# Patient Record
Sex: Female | Born: 1990 | Race: Black or African American | Hispanic: No | Marital: Single | Smoking: Never smoker
Health system: Southern US, Community
[De-identification: ages and names within clinical notes are randomized; demographics above are authoritative.]

## PROBLEM LIST (undated history)

## (undated) DIAGNOSIS — L600 Ingrowing nail: Secondary | ICD-10-CM

## (undated) DIAGNOSIS — R51 Headache: Secondary | ICD-10-CM

## (undated) DIAGNOSIS — R519 Headache, unspecified: Secondary | ICD-10-CM

## (undated) HISTORY — DX: Headache, unspecified: R51.9

## (undated) HISTORY — DX: Ingrowing nail: L60.0

## (undated) HISTORY — DX: Headache: R51

## (undated) HISTORY — PX: OTHER SURGICAL HISTORY: SHX169

---
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
## 2010-03-29 ENCOUNTER — Encounter: Payer: Self-pay | Admitting: Family Medicine

## 2010-03-29 ENCOUNTER — Ambulatory Visit
Admission: RE | Admit: 2010-03-29 | Discharge: 2010-03-29 | Payer: Self-pay | Source: Home / Self Care | Attending: Family Medicine | Admitting: Family Medicine

## 2010-04-24 NOTE — Letter (Signed)
Summary: Out of School  [facility] [place]  [address] [place], [place] [address]   Phone: [phone]  Fax: [phone]    Jul 27, 2009   Student:  Chelsea Fletcher    To Whom It May Concern:   For Medical reasons, please excuse the above named student from school for the following dates:  Start:   Jul 27, 2009  End:    Jul 27, 2009   If you need additional information, please feel free to contact our office.   Sincerely,    Doristine Devoid    ****This is a legal document and cannot be tampered with.  Schools are authorized to verify all information and to do so accordingly.

## 2010-04-24 NOTE — Letter (Signed)
Summary: Work Dietitian at [employer]  [address] [place], [place] [address]   Phone: [phone]  Fax: [phone]    Today's Date: July 17, 2009  Name of Patient: Chelsea Fletcher  The above named patient had a medical visit today at:  10:30am .  Please take this into consideration when reviewing the time away from school.    Special Instructions:  [ X ] None  [  ] To be off the remainder of today, returning to the normal work / school schedule tomorrow.  [  ] To be off until the next scheduled appointment on ______________________.  [  ] Other ________________________________________________________________ ________________________________________________________________________   Sincerely yours,   Neena Rhymes, MD  Appended Document: Work Excuse emailed to patient mother

## 2010-04-24 NOTE — Assessment & Plan Note (Signed)
Summary: DEPO SHOT//KN  Nurse Visit   Allergies: No Known Drug Allergies  Medication Administration  Injection # 1:    Medication: Depo-Provera 150mg     Diagnosis: CONTRACEPTIVE MANAGEMENT (ICD-V25.09)    Route: IM    Site: L deltoid    Exp Date: 04/25/2012    Lot #: [id number]    Mfr: Greenstone    Given by: Doristine Devoid CMA (October 18, 2009 4:47 PM)  Orders Added: 1)  Depo-Provera 150mg  [J1055] 2)  Admin of Therapeutic Inj  intramuscular or subcutaneous [96372]   Medication Administration  Injection # 1:    Medication: Depo-Provera 150mg     Diagnosis: CONTRACEPTIVE MANAGEMENT (ICD-V25.09)    Route: IM    Site: L deltoid    Exp Date: 04/25/2012    Lot #: [id number]    Mfr: Greenstone    Given by: Doristine Devoid CMA (October 18, 2009 4:47 PM)  Orders Added: 1)  Depo-Provera 150mg  [J1055] 2)  Admin of Therapeutic Inj  intramuscular or subcutaneous [09811]

## 2010-04-24 NOTE — Assessment & Plan Note (Signed)
Summary: first depo injection//lch  Nurse Visit   Allergies: No Known Drug Allergies  Medication Administration  Injection # 1:    Medication: Depo-Provera 150mg     Diagnosis: CONTRACEPTIVE MANAGEMENT (ICD-V25.09)    Route: IM    Site: R deltoid    Exp Date: 07/24/2011    Lot #: [id number]    Mfr: Francisca December    Given by: Doristine Devoid (Jul 27, 2009 4:40 PM)  Orders Added: 1)  Depo-Provera 150mg  [J1055] 2)  Admin of Therapeutic Inj  intramuscular or subcutaneous [96372]   Medication Administration  Injection # 1:    Medication: Depo-Provera 150mg     Diagnosis: CONTRACEPTIVE MANAGEMENT (ICD-V25.09)    Route: IM    Site: R deltoid    Exp Date: 07/24/2011    Lot #: [id number]    Mfr: Francisca December    Given by: Doristine Devoid (Jul 27, 2009 4:40 PM)  Orders Added: 1)  Depo-Provera 150mg  [J1055] 2)  Admin of Therapeutic Inj  intramuscular or subcutaneous [95621]

## 2010-04-24 NOTE — Letter (Signed)
Summary: Immunization/Shot Record  [facility]  [address] [place], [place] [address]   Phone: [phone]  Fax: [phone]     Immunization Record for: [facility]  Vaccine 1 2 3 4 5 6  HepB Hepatitis B 01/03/2003      02/07/2003      07/04/2003          DTP Diphtheria, Tetanus, Pertussis 09/18/1990      11/17/1990      03/22/1991      10/19/1991    07/25/1994       HIB Haemophilus influenzae Type b 09/18/1990     11/17/1990     03/22/1991     10/19/1991     XBMWUXLKGM IPV Inactivated Poliovirus 09/18/1990    11/17/1990    10/19/1991    07/25/1994    MMR Measles, Mumps, Rubella 10/19/1991 07/25/1994  Marguerite Olea WNUUVOZDGU YQIHKVQQVZ Varicella Varivax    DGLOVFIEPP IRJJOACZYS AYTKZSWFUX Pneumococcal           Hep A Hepatitis A 10/08/2006  04/10/2007  Marguerite Olea NATFTDDUKG URKYHCWCBJ SEGBTDVVOH        Tetanus Booster Date of Last: Historical 09/24/2004  Flu Shot Date of Last:  Pneumovax Date of Last:  Meningococcal Vaccine Given: Historical 07/28/2006     Other Vaccines HPV Vaccine/ Date of Last: Gardasil 07/28/2006  Vaccine/ Date of Last: Gardasil 10/08/2006  Vaccine/ Date of Last: Gardasil 03/04/2007   Marguerite Olea  YWVPXTGGYI  RSWNIOEVOJ Rotavirus Vaccine/ Date of Last:    Vaccine/ Date of Last:    Vaccine/ Date of Last:     JKKXFGHWEX  [facility]  HBZJIRCVEL Zostavax Vaccine/ Date of Last:     Marguerite Olea  FYBOFBPZWC  Marguerite Olea  HENIDPOEUM  PNTIRWERXV  Recommended Childhood and Adolescent Immunization Schedule [place]  2006 Vaccine Age Birth 1 mos 2 mos 4 mos 6 mos 12 mos 15 mos 18 mos 24 mos 4-6 yrs 11-12 yrs 13-14 yrs 15 yrs 16-18 yrs Hepatitis B1 HepB HepB HepB1  HepB  HepB Series Catch-Up Diphtheria, Tetanus, Pertussis2   DTaP DTaP DTaP   DTaP  DTaP Tdap  Tdap  Catch-Up Haemophilus influenzae type b3   Hib Hib Hib3 Hib        Inactivated Poliovirus   IPV IPV  IPV   IPV     Measles, Mumps, Rubella4      MMR   MMR M MR MMR Catch-Up Varicella5       Varicella  Varicella Catch-Up Meningo-coccal6           MCV4  MCV4 CatchUpV4           MPSV4 for High Risk Groups  C MCV4 for High Risk Groups Pneumo-coccal7   PCV PCV PCV PCV   PCV  Catch-Up PPV for High Risk Groups         PPV for High Risk Groups  Influenza8      Influenza (Yearly)  Influenza (Yearly) for High Risk Groups Hepatitis A9       HepA Series  This schedule indicates the recommended ages for routine administration of currently licensed childhood vaccines, as of February 23, 2004, for children through age 18 years. Any dose not administered at the recommended age should be administered at any subsequent visit when indicated and feasible. Indicates age groups that warrant special effort to administer those vaccines not previously administered. Additional vaccines may be licensed and recommended during the year. Licensed combination vaccines may be used whenever any components of the combination are indicated  and other components of the vaccine are not contraindicated and if approved by the Food and Drug Administration for that dose of the series. Providers should consult the respective ACIP statement for detailed recommendations. Clinically significant adverse events that follow immunization should be reported to the Vaccine Adverse Event Reporting System (VAERS). Guidance about how to obtain and complete a VAERS form is available at www.vaers.LAgents.no or by telephone, (516)[phone].  The Childhood and Adolescent Immunization Schedule is approved by: Advisory Committee on Administrator http://www.wade.com/   American Academy of Pediatrics BridgeDigest.com.cy   American Academy of [employer].aafp.org

## 2010-04-24 NOTE — Assessment & Plan Note (Signed)
Summary: DISCUSS BIRTHCONTROL/KDC   Vital Signs:  Patient profile:   20 year old female Weight:      166 pounds Pulse rate:   86 / minute BP sitting:   114 / 76  (left arm)  Vitals Entered By: Doristine Devoid (July 17, 2009 10:44 AM) CC: discuss birth control and L eye redness and itching    History of Present Illness: 20 yo girl here today for   1) birth control- forgets to take pills regularly.  last week was in middle of pack and started bleeding.  pt has 1 week left of pills.  wants to switch methods b/c she isn't taking her meds properly.  2) L eye pain- started last night, red, watery.  became irritated after washing her face.  wears contacts.  last changed 1 week ago, supposed to change monthly.  has glasses she can wear.  Allergies (verified): No Known Drug Allergies  Review of Systems      See HPI  Physical Exam  General:  Well-developed,well-nourished,in no acute distress; alert,appropriate and cooperative throughout examination Head:  NCAT Eyes:  L eye injected, excessive tearing.  no pain w/ pressure on the globe  R eye minimal injxn Ears:  TMs WNL Nose:  + congestion Mouth:  +PND   Impression & Recommendations:  Problem # 1:  CONTRACEPTIVE MANAGEMENT (ICD-V25.09) Assessment Unchanged pt not taking OCPs daily as directed.  having breakthrough bleeding and more importantly is not protected from pregnancy.  pt has 1 week of pills remaining, will return next week for Depo injxn.  reviewed dosing schedule and side effects.  pt aware.  Problem # 2:  CONJUNCTIVITIS (ICD-372.30) Assessment: New pt's eye sxs and appearance most consistent w/ conjunctivitis.  start abx drops.  reviewed importance of removing contacts, encouraged pt to switch pairs but if not possible should clean thoroughly. Her updated medication list for this problem includes:    Ciprofloxacin Hcl 0.3 % Soln (Ciprofloxacin hcl) .Marland Kitchen... 2 drops every 2 hours for 2 days and then 2 drops every 4 hrs  for 5 days.  Complete Medication List: 1)  Sprintec 28 0.25-35 Mg-mcg Tabs (Norgestimate-eth estradiol) .... Take 1 pill daily as directed.  disp 1 pack 2)  Ciprofloxacin Hcl 0.3 % Soln (Ciprofloxacin hcl) .... 2 drops every 2 hours for 2 days and then 2 drops every 4 hrs for 5 days.  Patient Instructions: 1)  Please schedule a nurse visit for middle of next week to start your depo 2)  Use the eye drops as directed 3)  DO NOT wear your contacts until you finish your eye drops 4)  Wash your contacts well to avoid re-infecting your eye 5)  Don't be surprised if it spreads to the other eye 6)  Hang in there! Prescriptions: CIPROFLOXACIN HCL 0.3 % SOLN (CIPROFLOXACIN HCL) 2 drops every 2 hours for 2 days and then 2 drops every 4 hrs for 5 days.  #10 ml x 0   Entered and Authorized by:   Neena Rhymes MD   Signed by:   Neena Rhymes MD on 07/17/2009   Method used:   Print then Give to Patient   RxID:   [id number]

## 2010-04-26 NOTE — Miscellaneous (Signed)
Summary: Depo Injection/ECU  Depo Injection/ECU   Imported By: Lanelle Bal 04/06/2010 08:29:41  _____________________________________________________________________  External Attachment:    Type:   Image     Comment:   External Document

## 2010-04-26 NOTE — Assessment & Plan Note (Signed)
Summary: DEPO SHOT/KN  Nurse Visit   Allergies: No Known Drug Allergies  Medication Administration  Injection # 1:    Medication: Depo-Provera 150mg     Diagnosis: CONTRACEPTIVE MANAGEMENT (ICD-V25.09)    Route: IM    Site: L deltoid    Exp Date: 05/23/2012    Lot #: [id number]    Mfr: [email or web address]    Comments: Last injection given on 01-18-10    Patient tolerated injection without complications    Given by: Jeremy Johann CMA (March 29, 2010 2:36 PM)  Orders Added: 1)  Admin of Therapeutic Inj  intramuscular or subcutaneous [96372] 2)  Depo-Provera 150mg  [J1055]

## 2010-05-28 ENCOUNTER — Encounter: Payer: Self-pay | Admitting: Family Medicine

## 2010-05-28 ENCOUNTER — Other Ambulatory Visit: Payer: BC Managed Care – PPO

## 2010-06-15 ENCOUNTER — Ambulatory Visit (INDEPENDENT_AMBULATORY_CARE_PROVIDER_SITE_OTHER): Payer: BC Managed Care – PPO | Admitting: *Deleted

## 2010-06-15 DIAGNOSIS — Z3009 Encounter for other general counseling and advice on contraception: Secondary | ICD-10-CM

## 2010-06-15 MED ORDER — MEDROXYPROGESTERONE ACETATE 150 MG/ML IM SUSP
150.0000 mg | Freq: Once | INTRAMUSCULAR | Status: AC
  Administered 2010-06-15: 150 mg via INTRAMUSCULAR
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
## 2010-08-10 ENCOUNTER — Encounter: Payer: Self-pay | Admitting: Family Medicine

## 2010-08-28 ENCOUNTER — Ambulatory Visit (INDEPENDENT_AMBULATORY_CARE_PROVIDER_SITE_OTHER): Payer: BC Managed Care – PPO | Admitting: *Deleted

## 2010-08-28 DIAGNOSIS — Z3009 Encounter for other general counseling and advice on contraception: Secondary | ICD-10-CM

## 2010-08-28 MED ORDER — MEDROXYPROGESTERONE ACETATE 150 MG/ML IM SUSP
150.0000 mg | Freq: Once | INTRAMUSCULAR | Status: AC
  Administered 2010-08-28: 150 mg via INTRAMUSCULAR

## 2010-11-09 ENCOUNTER — Ambulatory Visit (INDEPENDENT_AMBULATORY_CARE_PROVIDER_SITE_OTHER): Payer: BC Managed Care – PPO | Admitting: *Deleted

## 2010-11-09 ENCOUNTER — Telehealth: Payer: Self-pay | Admitting: Family Medicine

## 2010-11-09 DIAGNOSIS — Z309 Encounter for contraceptive management, unspecified: Secondary | ICD-10-CM

## 2010-11-09 NOTE — Telephone Encounter (Signed)
Can get shot early- she just can't get it late.  Ok w/ her having it today

## 2010-11-09 NOTE — Telephone Encounter (Signed)
Pt is coming in today. I misunderstood what Chelsea Fletcher said.

## 2010-11-12 DIAGNOSIS — Z309 Encounter for contraceptive management, unspecified: Secondary | ICD-10-CM

## 2010-11-12 MED ORDER — MEDROXYPROGESTERONE ACETATE 150 MG/ML IM SUSP: 150.0000 mg | INTRAMUSCULAR | Status: DC

## 2010-11-12 MED ORDER — MEDROXYPROGESTERONE ACETATE 150 MG/ML IM SUSP
150.0000 mg | Freq: Once | INTRAMUSCULAR | Status: AC
  Administered 2010-11-12: 150 mg via INTRAMUSCULAR

## 2010-12-24 LAB — URINALYSIS, ROUTINE W REFLEX MICROSCOPIC
Bilirubin Urine: NEGATIVE
Ketones, ur: 15 — AB
Leukocytes, UA: NEGATIVE
Specific Gravity, Urine: 1.037 — ABNORMAL HIGH
Urobilinogen, UA: 1
pH: 6

## 2010-12-24 LAB — URINE MICROSCOPIC-ADD ON

## 2010-12-24 LAB — DIFFERENTIAL
Basophils Absolute: 0
Eosinophils Absolute: 0.1
Eosinophils Relative: 1
Monocytes Absolute: 0.5
Neutrophils Relative %: 74 — ABNORMAL HIGH

## 2010-12-24 LAB — BASIC METABOLIC PANEL
BUN: 3 — ABNORMAL LOW
BUN: 9
CO2: 24
Chloride: 106
Creatinine, Ser: 0.71
Glucose, Bld: 101 — ABNORMAL HIGH
Potassium: 3.5
Potassium: 3.6

## 2010-12-24 LAB — CBC
HCT: 31 — ABNORMAL LOW
HCT: 32 — ABNORMAL LOW
HCT: 32 — ABNORMAL LOW
HCT: 32.9 — ABNORMAL LOW
HCT: 32.9 — ABNORMAL LOW
HCT: 33.9 — ABNORMAL LOW
HCT: 34.7 — ABNORMAL LOW
Hemoglobin: 10.5 — ABNORMAL LOW
Hemoglobin: 10.6 — ABNORMAL LOW
Hemoglobin: 11.1 — ABNORMAL LOW
MCHC: 32.8
MCHC: 33
MCHC: 33
MCHC: 33
MCHC: 33
MCV: 86.7
MCV: 88.4
MCV: 89.3
MCV: 89.7
Platelets: 199
Platelets: 207
Platelets: 210
RBC: 3.61 — ABNORMAL LOW
RBC: 3.97
RBC: 4
RBC: 4.9
RDW: 12.9
RDW: 13.1
RDW: 13.2
WBC: 5
WBC: 5.5
WBC: 5.9
WBC: 6.2
WBC: 7.7

## 2010-12-24 LAB — COMPREHENSIVE METABOLIC PANEL
Albumin: 3.7
Alkaline Phosphatase: 57
CO2: 21
Chloride: 109
Glucose, Bld: 123 — ABNORMAL HIGH
Potassium: 3.1 — ABNORMAL LOW
Sodium: 138

## 2010-12-24 LAB — HEMOGLOBIN AND HEMATOCRIT, BLOOD
HCT: 31.4 — ABNORMAL LOW
Hemoglobin: 10.4 — ABNORMAL LOW

## 2010-12-24 LAB — TYPE AND SCREEN
ABO/RH(D): A POS
Antibody Screen: NEGATIVE

## 2010-12-24 LAB — GLUCOSE, CAPILLARY

## 2011-01-01 LAB — URINE MICROSCOPIC-ADD ON

## 2011-01-01 LAB — URINALYSIS, ROUTINE W REFLEX MICROSCOPIC
Bilirubin Urine: NEGATIVE
Nitrite: NEGATIVE
Specific Gravity, Urine: 1.017
Urobilinogen, UA: 0.2

## 2011-01-01 LAB — POCT PREGNANCY, URINE: Preg Test, Ur: NEGATIVE

## 2011-01-18 ENCOUNTER — Ambulatory Visit (INDEPENDENT_AMBULATORY_CARE_PROVIDER_SITE_OTHER): Payer: BC Managed Care – PPO | Admitting: *Deleted

## 2011-01-18 DIAGNOSIS — Z3009 Encounter for other general counseling and advice on contraception: Secondary | ICD-10-CM

## 2011-01-18 MED ORDER — MEDROXYPROGESTERONE ACETATE 150 MG/ML IM SUSP
150.0000 mg | Freq: Once | INTRAMUSCULAR | Status: AC
  Administered 2011-01-18: 150 mg via INTRAMUSCULAR

## 2011-03-27 ENCOUNTER — Telehealth: Payer: Self-pay | Admitting: *Deleted

## 2011-03-27 NOTE — Telephone Encounter (Signed)
i have never heard anything about periods in relation to depo injxns.  As long as she is on schedule w/ her depo and in her window, there is no reason not to have the shot.

## 2011-03-27 NOTE — Telephone Encounter (Signed)
Pt left vm stating that she is scheduled for a depo-provera shot on 03-29-11 however she noted her period came on the 03-05-11 and was told before that the earliest she can receive the shot is 7 days prior to period can she still have the shot on the 4th? Noted pt number in chart are all incorrect and pt left a number on message [phone] left vm to verfiy further information

## 2011-03-29 ENCOUNTER — Ambulatory Visit (INDEPENDENT_AMBULATORY_CARE_PROVIDER_SITE_OTHER): Payer: BC Managed Care – PPO | Admitting: *Deleted

## 2011-03-29 DIAGNOSIS — Z309 Encounter for contraceptive management, unspecified: Secondary | ICD-10-CM

## 2011-03-29 MED ORDER — MEDROXYPROGESTERONE ACETATE 150 MG/ML IM SUSP
150.0000 mg | Freq: Once | INTRAMUSCULAR | Status: AC
  Administered 2011-03-29: 150 mg via INTRAMUSCULAR

## 2011-03-29 NOTE — Telephone Encounter (Signed)
Pt came in on 03-29-11 and received depo shot with no concerns expressed to other CMA

## 2011-06-07 ENCOUNTER — Ambulatory Visit: Payer: BC Managed Care – PPO

## 2011-10-30 ENCOUNTER — Encounter: Payer: Self-pay | Admitting: Women's Health

## 2011-10-30 ENCOUNTER — Other Ambulatory Visit (HOSPITAL_COMMUNITY)
Admission: RE | Admit: 2011-10-30 | Discharge: 2011-10-30 | Disposition: A | Discharge status: Home / Self Care | Payer: BC Managed Care – PPO | Source: Ambulatory Visit | Attending: Obstetrics and Gynecology | Admitting: Obstetrics and Gynecology

## 2011-10-30 ENCOUNTER — Ambulatory Visit (INDEPENDENT_AMBULATORY_CARE_PROVIDER_SITE_OTHER): Payer: BC Managed Care – PPO | Admitting: Women's Health

## 2011-10-30 VITALS — Ht 69.25 in | Wt 177.0 lb

## 2011-10-30 DIAGNOSIS — Z01419 Encounter for gynecological examination (general) (routine) without abnormal findings: Secondary | ICD-10-CM | POA: Insufficient documentation

## 2011-10-30 DIAGNOSIS — Z113 Encounter for screening for infections with a predominantly sexual mode of transmission: Secondary | ICD-10-CM

## 2011-10-30 DIAGNOSIS — IMO0001 Reserved for inherently not codable concepts without codable children: Secondary | ICD-10-CM

## 2011-10-30 DIAGNOSIS — Z309 Encounter for contraceptive management, unspecified: Secondary | ICD-10-CM

## 2011-10-30 DIAGNOSIS — Z23 Encounter for immunization: Secondary | ICD-10-CM

## 2011-10-30 MED ORDER — NORGESTIMATE-ETH ESTRADIOL 0.25-35 MG-MCG PO TABS: 1.0000 | ORAL_TABLET | Freq: Every day | ORAL | Status: DC

## 2011-10-30 NOTE — Patient Instructions (Addendum)
Health Maintenance, 18- to 21-Year-Old SCHOOL PERFORMANCE After high school completion, the Johnmatthew Solorio adult may be attending college, technical or vocational school, or entering the military or the work force. SOCIAL AND EMOTIONAL DEVELOPMENT The Aquilla Shambley adult establishes adult relationships and explores sexual identity. Derryck Shahan adults may be living at home or in a college dorm or apartment. Increasing independence is important with Marielouise Amey adults. Throughout adolescence, teens should assume responsibility of their own health care. IMMUNIZATIONS Most Lesieli Bresee adults should be fully vaccinated. A booster dose of Tdap (tetanus, diphtheria, and pertussis, or "whooping cough"), a dose of meningococcal vaccine to protect against a certain type of bacterial meningitis, hepatitis A, human papillomarvirus (HPV), chickenpox, or measles vaccines may be indicated, if not given at an earlier age. Annual influenza or "flu" vaccination should be considered during flu season.  TESTING Annual screening for vision and hearing problems is recommended. Vision should be screened objectively at least once between 18 and 21 years of age. The Anel Creighton adult may be screened for anemia or tuberculosis. Makeda Peeks adults should have a blood test to check for high cholesterol during this time period. River Mckercher adults should be screened for use of alcohol and drugs. If the Sayvion Vigen adult is sexually active, screening for sexually transmitted infections, pregnancy, or HIV may be performed. Screening for cervical cancer should be performed within 3 years of beginning sexual activity. NUTRITION AND ORAL HEALTH  Adequate calcium intake is important. Consume 3 servings of low-fat milk and dairy products daily. For those who do not drink milk or consume dairy products, calcium enriched foods, such as juice, bread, or cereal, dark, leafy greens, or canned fish are alternate sources of calcium.   Drink plenty of water. Limit fruit juice to 8 to 12 ounces per day.  Avoid sugary beverages or sodas.   Discourage skipping meals, especially breakfast. Teens should eat a good variety of vegetables and fruits, as well as lean meats.   Avoid high fat, high salt, and high sugar foods, such as candy, chips, and cookies.   Encourage Grant Henkes adults to participate in meal planning and preparation.   Eat meals together as a family whenever possible. Encourage conversation at mealtime.   Limit fast food choices and eating out at restaurants.   Brush teeth twice a day and floss.   Schedule dental exams twice a year.  SLEEP Regular sleep habits are important. PHYSICAL, SOCIAL, AND EMOTIONAL DEVELOPMENT  One hour of regular physical activity daily is recommended. Continue to participate in sports.   Encourage Mel Langan adults to develop their own interests and consider community service or volunteerism.   Provide guidance to the Deforest Maiden adult in making decisions about college and work plans.   Make sure that Bushra Denman adults know that they should never be in a situation that makes them uncomfortable, and they should tell partners if they do not want to engage in sexual activity.   Talk to the Markeda Narvaez adult about body image. Eating disorders may be noted at this time. Sabre Romberger adults may also be concerned about being overweight. Monitor the Holmes Hays adult for weight gain or loss.   Mood disturbances, depression, anxiety, alcoholism, or attention problems may be noted in Oluwatomisin Hustead adults. Talk to the caregiver if there are concerns about mental illness.   Negotiate limit setting and independent decision making.   Encourage the Samie Reasons adult to handle conflict without physical violence.   Avoid loud noises which may impair hearing.   Limit television and computer time to 2 hours per day.   Individuals who engage in excessive sedentary activity are more likely to become overweight.  RISK BEHAVIORS  Sexually active Taejah Ohalloran adults need to take precautions against pregnancy and sexually  transmitted infections. Talk to Barabara Motz adults about contraception.   Provide a tobacco-free and drug-free environment for the Garlene Apperson adult. Talk to the Osie Amparo adult about drug, tobacco, and alcohol use among friends or at friends' homes. Make sure the Jacky Dross adult knows that smoking tobacco or marijuana and taking drugs have health consequences and may impact brain development.   Teach the Conni Knighton adult about appropriate use of over-the-counter or prescription medicines.   Establish guidelines for driving and for riding with friends.   Talk to Zadkiel Dragan adults about the risks of drinking and driving or boating. Encourage the Laurna Shetley adult to call you if he or she or friends have been drinking or using drugs.   Remind Chayanne Speir adults to wear seat belts at all times in cars and life vests in boats.   Harlan Ervine adults should always wear a properly fitted helmet when they are riding a bicycle.   Use caution with all-terrain vehicles (ATVs) or other motorized vehicles.   Do not keep handguns in the home. (If you do, the gun and ammunition should be locked separately and out of the Davanee Klinkner adult's access.)   Equip your home with smoke detectors and change the batteries regularly. Make sure all family members know the fire escape plans for your home.   Teach Berlene Dixson adults not to swim alone and not to dive in shallow water.   All individuals should wear sunscreen that protects against UVA and UVB light with at least a sun protection factor (SPF) of 30 when out in the sun. This minimizes sun burning.  WHAT'S NEXT? Katharine Rochefort adults should visit their pediatrician or family physician yearly. By Suhas Estis adulthood, health care should be transitioned to a family physician or internal medicine specialist. Sexually active females may want to begin annual physical exams with a gynecologist. Document Released: 06/06/2006 Document Revised: 02/28/2011 Document Reviewed: 06/26/2006 ExitCare Patient Information 2012 [employer]. 

## 2011-10-30 NOTE — Progress Notes (Signed)
Chelsea Fletcher 03/31/1990 [id number]    History:    New patient presents for annual exam.  Has been on Depo-Provera 150 every 12 weeks for two-year, having irregular bleeding and requesting a change. Not sexually active for greater than 6 months. One partner for 2 years. Gardasil series completed in 08.   Past medical history, past surgical history, family history and social history were all reviewed and documented in the EPIC chart. 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   ROS:  A  ROS was performed and pertinent positives and negatives are included in the history.  Exam:  There were no vitals filed for this visit.  General appearance:  Normal Head/Neck:  Normal, without cervical or supraclavicular adenopathy. Thyroid:  Symmetrical, normal in size, without palpable masses or nodularity. Respiratory  Effort:  Normal  Auscultation:  Clear without wheezing or rhonchi Cardiovascular  Auscultation:  Regular rate, without rubs, murmurs or gallops  Edema/varicosities:  Not grossly evident Abdominal  Soft,nontender, without masses, guarding or rebound.  Liver/spleen:  No organomegaly noted  Hernia:  None appreciated  Skin  Inspection:  Grossly normal  Palpation:  Grossly normal Neurologic/psychiatric  Orientation:  Normal with appropriate conversation.  Mood/affect:  Normal  Genitourinary    Breasts: Examined lying and sitting.     Right: Without masses, retractions, discharge or axillary adenopathy.     Left: Without masses, retractions, discharge or axillary adenopathy.   Inguinal/mons:  Normal without inguinal adenopathy  External genitalia:  Normal  BUS/Urethra/Skene's glands:  Normal  Bladder:  Normal  Vagina:  Normal  Cervix:  Normal  Uterus:   normal in size, shape and contour.  Midline and mobile  Adnexa/parametria:     Rt: Without masses or tenderness.   Lt: Without masses or tenderness.  Anus and  perineum: Normal   Assessment/Plan:  21 y.o. SBF G0  for annual exam requesting change in contraception/having irregular bleeding on Depo-Provera.  Contraception management STD screen  Plan: Contraception options reviewed, request pill. Ortho-Cyclen prescription, proper use, slight risk for blood clots and strokes reviewed. Discussed importance of taking daily, condoms if become sexually active, bleeding stopped yesterday will start pills now/next depo due in 3 weeks. Instructed to call if cycles do not regulate. CBC, UA, Pap, GC/Chlamydia, HIV, hepatitis B,C, RPR.   Patient stated had not received gardasil, gardasil given, then discovered completed series in 2008.     Harrington Challenger [facility], 4:50 PM 10/30/2011

## 2011-10-31 ENCOUNTER — Other Ambulatory Visit: Payer: BC Managed Care – PPO

## 2011-10-31 LAB — CBC WITH DIFFERENTIAL/PLATELET
Eosinophils Absolute: 0.1 10*3/uL (ref 0.0–0.7)
Hemoglobin: 12.8 g/dL (ref 12.0–15.0)
Lymphocytes Relative: 58 % — ABNORMAL HIGH (ref 12–46)
Lymphs Abs: 2.4 10*3/uL (ref 0.7–4.0)
MCH: 28.3 pg (ref 26.0–34.0)
Monocytes Relative: 9 % (ref 3–12)
Neutro Abs: 1.3 10*3/uL — ABNORMAL LOW (ref 1.7–7.7)
Neutrophils Relative %: 31 % — ABNORMAL LOW (ref 43–77)
RBC: 4.52 MIL/uL (ref 3.87–5.11)
WBC: 4.2 10*3/uL (ref 4.0–10.5)

## 2011-11-01 LAB — URINALYSIS W MICROSCOPIC + REFLEX CULTURE
Casts: NONE SEEN
Crystals: NONE SEEN
Glucose, UA: NEGATIVE mg/dL
Leukocytes, UA: NEGATIVE
Nitrite: NEGATIVE
Specific Gravity, Urine: 1.022 (ref 1.005–1.030)
pH: 6.5 (ref 5.0–8.0)

## 2011-11-01 LAB — RPR

## 2011-11-01 LAB — HEPATITIS C ANTIBODY: HCV Ab: NEGATIVE

## 2011-11-01 LAB — HIV ANTIBODY (ROUTINE TESTING W REFLEX): HIV: NONREACTIVE

## 2011-12-20 ENCOUNTER — Telehealth: Payer: Self-pay | Admitting: *Deleted

## 2011-12-20 DIAGNOSIS — IMO0001 Reserved for inherently not codable concepts without codable children: Secondary | ICD-10-CM

## 2011-12-20 MED ORDER — NORGESTIMATE-ETH ESTRADIOL 0.25-35 MG-MCG PO TABS: 1.0000 | ORAL_TABLET | Freq: Every day | ORAL | Status: DC

## 2011-12-20 NOTE — Telephone Encounter (Signed)
Pt calling requesting her birth control Rx sent to [facility] in [place]. rx sent.

## 2012-04-09 ENCOUNTER — Telehealth: Payer: Self-pay | Admitting: Family Medicine

## 2012-04-09 NOTE — Telephone Encounter (Signed)
Patient Information:  Caller Name: Saraiah  Phone: [phone]  Patient: Chelsea, Fletcher  Gender: Female  DOB: 1990-12-17  Age: 22 Years  PCP: Sheliah Hatch  Pregnant: No  Office Follow Up:  Does the office need to follow up with this patient?: No  Instructions For The Office: N/A   Symptoms  Reason For Call & Symptoms: Patient calling with pressure in both temples each morning and then developes into a h/a by afternoon.  Has same for a week. The pressure/pain increases when she bends her head down.  Reviewed Health History In EMR: Yes  Reviewed Medications In EMR: Yes  Reviewed Allergies In EMR: Yes  Reviewed Surgeries / Procedures: Yes  Date of Onset of Symptoms: 04/02/2012  Treatments Tried: Motrin  Treatments Tried Worked: Yes OB / GYN:  LMP: 04/09/2012  Guideline(s) Used:  Headache  Disposition Per Guideline:   See Today or Tomorrow in Office  Reason For Disposition Reached:   Unexplained headache that is present > 24 hours  Advice Given:  N/A  Appointment Scheduled:  04/10/2012 11:30:00 Appointment Scheduled Provider:  Sheliah Hatch.  Patient requested a later morning appointment for personal reasons.

## 2012-04-10 ENCOUNTER — Ambulatory Visit (INDEPENDENT_AMBULATORY_CARE_PROVIDER_SITE_OTHER): Payer: BC Managed Care – PPO | Admitting: Family Medicine

## 2012-04-10 ENCOUNTER — Encounter: Payer: Self-pay | Admitting: Family Medicine

## 2012-04-10 VITALS — BP 122/80 | HR 85 | Temp 98.3°F | Ht 69.0 in | Wt 193.2 lb

## 2012-04-10 DIAGNOSIS — G44009 Cluster headache syndrome, unspecified, not intractable: Secondary | ICD-10-CM

## 2012-04-10 DIAGNOSIS — G4489 Other headache syndrome: Secondary | ICD-10-CM | POA: Insufficient documentation

## 2012-04-10 DIAGNOSIS — J309 Allergic rhinitis, unspecified: Secondary | ICD-10-CM

## 2012-04-10 DIAGNOSIS — J302 Other seasonal allergic rhinitis: Secondary | ICD-10-CM | POA: Insufficient documentation

## 2012-04-10 MED ORDER — MOMETASONE FUROATE 50 MCG/ACT NA SUSP: 2.0000 | Freq: Every day | NASAL | Status: DC

## 2012-04-10 NOTE — Assessment & Plan Note (Signed)
New.  Pt's HA most likely due to untreated seasonal allergies.  No red flags on hx or PE.  Start daily antihistamine, nasonex spray.  Reviewed supportive care and red flags that should prompt return.  Pt expressed understanding and is in agreement w/ plan.

## 2012-04-10 NOTE — Patient Instructions (Addendum)
I think your headache is stress and allergy related Start Claritin or Zyrtec daily (available over the counter) Use the Nasonex- 2 sprays each nostril daily Drink plenty of fluids REST! Try and relax! Hang in there!

## 2012-04-10 NOTE — Progress Notes (Signed)
  Subjective:    Patient ID: Chelsea Fletcher, female    DOB: [birth year], 22 y.o.   MRN: [id number]  HPI HA- sxs started 1 week ago.  Pain occuring daily, bilateral temples.  Pain will come and go throughout the day.  No ear pain.  Denies visual changes- blurry or double vision.  Some sensitivity to light, no sensitivity to sound.  No nausea.  No dizziness.  No hx of similar.  No nasal congestion.  Pt reports other than HAs, feeling well.  HA will typically start evenings and then 1st thing in AM.  Currently wearing contacts- UTD on eye exam.  Denies visual strain.   Review of Systems For ROS see HPI     Objective:   Physical Exam  Vitals reviewed. Constitutional: She is oriented to person, place, and time. She appears well-developed and well-nourished. No distress.  HENT:  Head: Normocephalic and atraumatic.  Right Ear: Tympanic membrane normal.  Left Ear: Tympanic membrane normal.  Nose: Mucosal edema and rhinorrhea present. Right sinus exhibits no maxillary sinus tenderness and no frontal sinus tenderness. Left sinus exhibits no maxillary sinus tenderness and no frontal sinus tenderness.  Mouth/Throat: Mucous membranes are normal. Posterior oropharyngeal erythema (w/ PND) present.  Eyes: Conjunctivae normal and EOM are normal. Pupils are equal, round, and reactive to light.  Neck: Normal range of motion. Neck supple.  Cardiovascular: Normal rate, regular rhythm and normal heart sounds.   Pulmonary/Chest: Effort normal and breath sounds normal. No respiratory distress. She has no wheezes. She has no rales.  Lymphadenopathy:    She has no cervical adenopathy.  Neurological: She is alert and oriented to person, place, and time. She has normal reflexes. No cranial nerve deficit. Coordination normal.  Skin: Skin is warm and dry.  Psychiatric: She has a normal mood and affect. Her behavior is normal. Thought content normal.          Assessment & Plan:

## 2012-04-10 NOTE — Assessment & Plan Note (Signed)
New.  Start daily antihistamine and steroid nasal spray.

## 2012-07-30 ENCOUNTER — Ambulatory Visit (INDEPENDENT_AMBULATORY_CARE_PROVIDER_SITE_OTHER): Payer: BC Managed Care – PPO | Admitting: Women's Health

## 2012-07-30 ENCOUNTER — Encounter: Payer: Self-pay | Admitting: Women's Health

## 2012-07-30 DIAGNOSIS — L293 Anogenital pruritus, unspecified: Secondary | ICD-10-CM

## 2012-07-30 DIAGNOSIS — Z113 Encounter for screening for infections with a predominantly sexual mode of transmission: Secondary | ICD-10-CM

## 2012-07-30 DIAGNOSIS — N898 Other specified noninflammatory disorders of vagina: Secondary | ICD-10-CM

## 2012-07-30 LAB — WET PREP FOR TRICH, YEAST, CLUE
Clue Cells Wet Prep HPF POC: NONE SEEN
Trich, Wet Prep: NONE SEEN

## 2012-07-30 MED ORDER — FLUCONAZOLE 150 MG PO TABS: 150.0000 mg | ORAL_TABLET | Freq: Once | ORAL | Status: DC

## 2012-07-30 NOTE — Progress Notes (Signed)
Patient ID: Chelsea Fletcher, female   DOB: Jul 03, 1990, 22 y.o.   MRN: [id number] Presents with external vaginal itching and irritation for 5 days. Never had anything like this before. Denies vaginal discharge and urinary symptoms. Sexually active one week ago with new partner, used condoms. Chlamydia 2 years ago.  Ortho-cyclen. Currently menstruating. Completed Gardasil series.   Exam: appears well. External genitalia irritated. Vaginal introtius erythematous. Speculum exam- menstrual blood. Healthy pink os with no lesions. No CMT or adnexal fullness or tenderness. Wet prep-negative. GC/Chlamydia culture taken  Clinical yeast infection  Plan: Diflucan 150 mg. GC/Chlymidia pending. Continue Ortho-cyclen and condoms until permanent partner. Instructedto call if no relief of symptoms.

## 2012-07-30 NOTE — Patient Instructions (Addendum)
Monilial Vaginitis Vaginitis in a soreness, swelling and redness (inflammation) of the vagina and vulva. Monilial vaginitis is not a sexually transmitted infection. CAUSES  Yeast vaginitis is caused by yeast (candida) that is normally found in your vagina. With a yeast infection, the candida has overgrown in number to a point that upsets the chemical balance. SYMPTOMS   White, thick vaginal discharge.  Swelling, itching, redness and irritation of the vagina and possibly the lips of the vagina (vulva).  Burning or painful urination.  Painful intercourse. DIAGNOSIS  Things that may contribute to monilial vaginitis are:  Postmenopausal and virginal states.  Pregnancy.  Infections.  Being tired, sick or stressed, especially if you had monilial vaginitis in the past.  Diabetes. Good control will help lower the chance.  Birth control pills.  Tight fitting garments.  Using bubble bath, feminine sprays, douches or deodorant tampons.  Taking certain medications that kill germs (antibiotics).  Sporadic recurrence can occur if you become ill. TREATMENT  Your caregiver will give you medication.  There are several kinds of anti monilial vaginal creams and suppositories specific for monilial vaginitis. For recurrent yeast infections, use a suppository or cream in the vagina 2 times a week, or as directed.  Anti-monilial or steroid cream for the itching or irritation of the vulva may also be used. Get your caregiver's permission.  Painting the vagina with methylene blue solution may help if the monilial cream does not work.  Eating yogurt may help prevent monilial vaginitis. HOME CARE INSTRUCTIONS   Finish all medication as prescribed.  Do not have sex until treatment is completed or after your caregiver tells you it is okay.  Take warm sitz baths.  Do not douche.  Do not use tampons, especially scented ones.  Wear cotton underwear.  Avoid tight pants and panty  hose.  Tell your sexual partner that you have a yeast infection. They should go to their caregiver if they have symptoms such as mild rash or itching.  Your sexual partner should be treated as well if your infection is difficult to eliminate.  Practice safer sex. Use condoms.  Some vaginal medications cause latex condoms to fail. Vaginal medications that harm condoms are:  Cleocin cream.  Butoconazole (Femstat).  Terconazole (Terazol) vaginal suppository.  Miconazole (Monistat) (may be purchased over the counter). SEEK MEDICAL CARE IF:   You have a temperature by mouth above 102 F (38.9 C).  The infection is getting worse after 2 days of treatment.  The infection is not getting better after 3 days of treatment.  You develop blisters in or around your vagina.  You develop vaginal bleeding, and it is not your menstrual period.  You have pain when you urinate.  You develop intestinal problems.  You have pain with sexual intercourse. Document Released: 12/19/2004 Document Revised: 06/03/2011 Document Reviewed: 09/02/2008 ExitCare Patient Information 2013 [employer].  

## 2012-11-16 ENCOUNTER — Ambulatory Visit (INDEPENDENT_AMBULATORY_CARE_PROVIDER_SITE_OTHER): Payer: BC Managed Care – PPO | Admitting: Women's Health

## 2012-11-16 ENCOUNTER — Encounter: Payer: Self-pay | Admitting: Women's Health

## 2012-11-16 VITALS — BP 100/68 | Ht 69.0 in | Wt 170.0 lb

## 2012-11-16 DIAGNOSIS — Z113 Encounter for screening for infections with a predominantly sexual mode of transmission: Secondary | ICD-10-CM

## 2012-11-16 DIAGNOSIS — Z309 Encounter for contraceptive management, unspecified: Secondary | ICD-10-CM

## 2012-11-16 DIAGNOSIS — IMO0001 Reserved for inherently not codable concepts without codable children: Secondary | ICD-10-CM

## 2012-11-16 DIAGNOSIS — Z01419 Encounter for gynecological examination (general) (routine) without abnormal findings: Secondary | ICD-10-CM

## 2012-11-16 LAB — CBC WITH DIFFERENTIAL/PLATELET
Basophils Absolute: 0 10*3/uL (ref 0.0–0.1)
Basophils Relative: 1 % (ref 0–1)
Eosinophils Relative: 1 % (ref 0–5)
HCT: 35.9 % — ABNORMAL LOW (ref 36.0–46.0)
Hemoglobin: 12.1 g/dL (ref 12.0–15.0)
Lymphocytes Relative: 41 % (ref 12–46)
MCHC: 33.7 g/dL (ref 30.0–36.0)
MCV: 81.8 fL (ref 78.0–100.0)
Monocytes Absolute: 0.4 10*3/uL (ref 0.1–1.0)
Monocytes Relative: 8 % (ref 3–12)
Neutro Abs: 2.4 10*3/uL (ref 1.7–7.7)
RDW: 14.2 % (ref 11.5–15.5)

## 2012-11-16 LAB — HIV ANTIBODY (ROUTINE TESTING W REFLEX): HIV: NONREACTIVE

## 2012-11-16 LAB — HEPATITIS B SURFACE ANTIGEN: Hepatitis B Surface Ag: NEGATIVE

## 2012-11-16 LAB — RPR

## 2012-11-16 MED ORDER — NORGESTIMATE-ETH ESTRADIOL 0.25-35 MG-MCG PO TABS: 1.0000 | ORAL_TABLET | Freq: Every day | ORAL | Status: DC

## 2012-11-16 NOTE — Progress Notes (Signed)
Chelsea Fletcher 1991-02-12 [id number]    History:    The patient presents for annual exam.  Monthly cycle on Ortho-Cyclen/new partner. Gardasil series completed. Normal Pap 2013.   Past medical history, past surgical history, family history and social history were all reviewed and documented in the EPIC chart. Graduated from [employer] in [employer] starting a job in [place]. Father hypertension, mother hypercholesteremia.   ROS:  A  ROS was performed and pertinent positives and negatives are included in the history.  Exam:  Filed Vitals:   11/16/12 1207  BP: 100/68    General appearance:  Normal Head/Neck:  Normal, without cervical or supraclavicular adenopathy. Thyroid:  Symmetrical, normal in size, without palpable masses or nodularity. Respiratory  Effort:  Normal  Auscultation:  Clear without wheezing or rhonchi Cardiovascular  Auscultation:  Regular rate, without rubs, murmurs or gallops  Edema/varicosities:  Not grossly evident Abdominal  Soft,nontender, without masses, guarding or rebound.  Liver/spleen:  No organomegaly noted  Hernia:  None appreciated  Skin  Inspection:  Grossly normal  Palpation:  Grossly normal Neurologic/psychiatric  Orientation:  Normal with appropriate conversation.  Mood/affect:  Normal  Genitourinary    Breasts: Examined lying and sitting.     Right: Without masses, retractions, discharge or axillary adenopathy.     Left: Without masses, retractions, discharge or axillary adenopathy.   Inguinal/mons:  Normal without inguinal adenopathy  External genitalia:  Normal  BUS/Urethra/Skene's glands:  Normal  Bladder:  Normal  Vagina:  Normal  Cervix:  Normal  Uterus:   normal in size, shape and contour.  Midline and mobile  Adnexa/parametria:     Rt: Without masses or tenderness.   Lt: Without masses or tenderness.  Anus and perineum: Normal   Assessment/Plan:  22 y.o. SBF G0 for annual exam without complaint.  STD screen  Plan:  Ortho-Cyclen prescription, proper use, slight risk for blood clots and strokes reviewed. Condoms encouraged until permanent partner. SBE's, regular exercise, calcium rich diet, MVI daily encouraged. CBC, UA, GC/Chlamydia, HIV, hepatitis B., C., RPR. Pap normal 2013, new screening guidelines reviewed.    Harrington Challenger [facility], 12:39 PM 11/16/2012

## 2012-11-16 NOTE — Patient Instructions (Addendum)

## 2012-11-17 ENCOUNTER — Telehealth: Payer: Self-pay

## 2012-11-17 LAB — URINALYSIS W MICROSCOPIC + REFLEX CULTURE
Bacteria, UA: NONE SEEN
Bilirubin Urine: NEGATIVE
Casts: NONE SEEN
Crystals: NONE SEEN
Glucose, UA: NEGATIVE mg/dL
Ketones, ur: NEGATIVE mg/dL
Specific Gravity, Urine: 1.005 (ref 1.005–1.030)
Squamous Epithelial / LPF: NONE SEEN
Urobilinogen, UA: 0.2 mg/dL (ref 0.0–1.0)

## 2012-11-17 NOTE — Telephone Encounter (Signed)
Patient said when she went to pick up OC Rx at pharmacy that pharmacist advised her that her Topamax could be decreasing the effectiveness of her OC.  She said recently her "headache doctor" increased her Topamax dose.  She is sexually active.

## 2012-11-18 NOTE — Telephone Encounter (Signed)
Telephone call, will continue taking pills daily, reviewed do not want to change, may increase chances for headaches. Reviewed importance of condoms until permanent partner.

## 2012-11-27 ENCOUNTER — Encounter: Payer: BC Managed Care – PPO | Admitting: Women's Health

## 2012-12-21 ENCOUNTER — Telehealth: Payer: Self-pay | Admitting: *Deleted

## 2012-12-21 NOTE — Telephone Encounter (Signed)
Pt called to follow up regarding telephone encounter 11/17/12. Pt said she has noticed that the Topamax 200 mg is starting to decreasing the effectiveness of her OC.  She is noticing longer periods and a little more heavier bleeding, pt said that her MD may be increasing her Topamax again. She would like to know if switching pill to stronger dose would be option? Please advise

## 2012-12-22 MED ORDER — NORGESTREL-ETHINYL ESTRADIOL 0.3-30 MG-MCG PO TABS: 1.0000 | ORAL_TABLET | Freq: Every day | ORAL | Status: DC

## 2012-12-22 NOTE — Telephone Encounter (Signed)
Pt informed with the below note, Rx sent. Pt said in state of [place] a MD must sign Rx, I signed Rx with Dr.Ferendez since her is back up md.

## 2012-12-22 NOTE — Telephone Encounter (Signed)
Currently on Ortho-Cyclen could try LoOvral., best to use condoms, have her call if continued heavier cycles.

## 2013-03-26 ENCOUNTER — Encounter: Payer: Self-pay | Admitting: Gynecology

## 2013-03-26 ENCOUNTER — Ambulatory Visit (INDEPENDENT_AMBULATORY_CARE_PROVIDER_SITE_OTHER): Payer: BC Managed Care – PPO | Admitting: Gynecology

## 2013-03-26 DIAGNOSIS — Z113 Encounter for screening for infections with a predominantly sexual mode of transmission: Secondary | ICD-10-CM

## 2013-03-26 DIAGNOSIS — N898 Other specified noninflammatory disorders of vagina: Secondary | ICD-10-CM

## 2013-03-26 DIAGNOSIS — B373 Candidiasis of vulva and vagina: Secondary | ICD-10-CM

## 2013-03-26 DIAGNOSIS — B3731 Acute candidiasis of vulva and vagina: Secondary | ICD-10-CM

## 2013-03-26 LAB — WET PREP FOR TRICH, YEAST, CLUE
Clue Cells Wet Prep HPF POC: NONE SEEN
TRICH WET PREP: NONE SEEN

## 2013-03-26 MED ORDER — FLUCONAZOLE 150 MG PO TABS: 150.0000 mg | ORAL_TABLET | Freq: Once | ORAL | Status: DC

## 2013-03-26 NOTE — Progress Notes (Signed)
Patient presents with several days of vulvar pruritus. No discharge or odor. No urinary symptoms such as frequency dysuria or urgency. Also requested STD screening. No known exposure but wants to be screened. Had screening 10/2012 when she saw Harriett SineNancy to include GC chlamydia HIV RPR hepatitis B and hepatitis C. Risk factors for exposure discussed. Ultimately patient [facility] to be [facility] and [facility] only.  Exam with Selena BattenKim assistant External BUS vagina with white discharge. Cervix normal. GC/Chlamydia screen done. Uterus normal size, mobile nontender. Adnexa without masses or tenderness.  Assessment and plan: Exam, symptoms and wet prep consistent with yeast vaginitis. Will treat with Diflucan 150 mg every other day x2 doses. GC/Chlamydia screening of the cervix was done. Followup if symptoms persist, worsen or recur

## 2013-03-26 NOTE — Patient Instructions (Signed)
Take Diflucan pill and repeat 2 days later. Followup if symptoms persist, worsen or recur.

## 2013-03-28 LAB — GC/CHLAMYDIA PROBE AMP
CT PROBE, AMP APTIMA: NEGATIVE
GC PROBE AMP APTIMA: NEGATIVE

## 2013-04-13 ENCOUNTER — Telehealth: Payer: Self-pay | Admitting: *Deleted

## 2013-04-13 MED ORDER — TERCONAZOLE 0.8 % VA CREA: TOPICAL_CREAM | VAGINAL | Status: DC

## 2013-04-13 NOTE — Telephone Encounter (Signed)
Please call, Terazol 3 vaginal cream 1 applicator at bedtime x3. Instruct her to call if no relief of symptoms. [place]

## 2013-04-13 NOTE — Telephone Encounter (Signed)
Pt calling c/o vaginal itching, irritation saw TF on 03/26/13 was give Diflucan 150 # 2. symptoms started about 1 week ago. Please advise

## 2013-04-13 NOTE — Telephone Encounter (Signed)
rx sent, pt informed.  

## 2013-05-07 ENCOUNTER — Telehealth: Payer: Self-pay | Admitting: *Deleted

## 2013-05-07 NOTE — Telephone Encounter (Signed)
Telephone call, itching is relieved, wear loose clothes, allow to heal if irritation persist instructed to call back.

## 2013-05-07 NOTE — Telephone Encounter (Signed)
Pt calling to follow up from telephone encounter 04/13/13 used Terazol 3 days cream and only has irritation now. Vaginal very irritated, no itching or discharge. [place] lives in [place].  Please advise

## 2013-06-14 ENCOUNTER — Encounter: Payer: Self-pay | Admitting: Gynecology

## 2013-06-14 ENCOUNTER — Ambulatory Visit (INDEPENDENT_AMBULATORY_CARE_PROVIDER_SITE_OTHER): Payer: BC Managed Care – PPO | Admitting: Gynecology

## 2013-06-14 VITALS — BP 112/76

## 2013-06-14 DIAGNOSIS — N898 Other specified noninflammatory disorders of vagina: Secondary | ICD-10-CM

## 2013-06-14 DIAGNOSIS — B3731 Acute candidiasis of vulva and vagina: Secondary | ICD-10-CM

## 2013-06-14 DIAGNOSIS — Z113 Encounter for screening for infections with a predominantly sexual mode of transmission: Secondary | ICD-10-CM

## 2013-06-14 DIAGNOSIS — B373 Candidiasis of vulva and vagina: Secondary | ICD-10-CM

## 2013-06-14 LAB — WET PREP FOR TRICH, YEAST, CLUE
Clue Cells Wet Prep HPF POC: NONE SEEN
Trich, Wet Prep: NONE SEEN

## 2013-06-14 MED ORDER — METRONIDAZOLE 500 MG PO TABS: 500.0000 mg | ORAL_TABLET | Freq: Two times a day (BID) | ORAL | Status: DC

## 2013-06-14 MED ORDER — FLUCONAZOLE 150 MG PO TABS: ORAL_TABLET | ORAL | Status: DC

## 2013-06-14 NOTE — Progress Notes (Signed)
   23 year old presents to the office today complaining of vaginal discharge and vulvar pruritus. The patient states that she has a new sexual partner. Patient wanted to have an STD screen. Patient stated many years ago she was treated for GC. She's currently on oral contraceptive pills and 7 normal menstrual cycles. She stated that several years ago she completed the HPV vaccine series.  Exam: Bartholin's urethra Skene was within normal limits Vagina creamy like discharge with no odor noted Cervix: No lesions or discharge Uterus: Not examined Adnexa: Not examined Rectal exam: Not examined  Wet prep: Many E. stomach, too numerous to count WBC, few bacteria  Assessment/plan: Patient with clinical evidence of yeast vulvovaginitis will be treated with Diflucan 150 mg one today and to repeat in 48 hours. Also what appears to be concurrent BV she will be placed on Flagyl 500 mg one by mouth twice a day for 7 days. A GC and chlamydia culture was obtained today as well as the following blood work: HIV, RPR, hepatitis B and C.

## 2013-06-14 NOTE — Patient Instructions (Signed)
Fluconazole tablets What is this medicine? FLUCONAZOLE (floo KON na zole) is an antifungal medicine. It is used to treat certain kinds of fungal or yeast infections. This medicine may be used for other purposes; ask your health care provider or pharmacist if you have questions. COMMON BRAND NAME(S): Diflucan What should I tell my health care provider before I take this medicine? They need to know if you have any of these conditions: -electrolyte abnormalities -history of irregular heart beat -kidney disease -an unusual or allergic reaction to fluconazole, other azole antifungals, medicines, foods, dyes, or preservatives -pregnant or trying to get pregnant -breast-feeding How should I use this medicine? Take this medicine by mouth. Follow the directions on the prescription label. Do not take your medicine more often than directed. Talk to your pediatrician regarding the use of this medicine in children. Special care may be needed. This medicine has been used in children as young as 6 months of age. Overdosage: If you think you have taken too much of this medicine contact a poison control center or emergency room at once. NOTE: This medicine is only for you. Do not share this medicine with others. What if I miss a dose? If you miss a dose, take it as soon as you can. If it is almost time for your next dose, take only that dose. Do not take double or extra doses. What may interact with this medicine? Do not take this medicine with any of the following medications: -astemizole -certain medicines for irregular heart beat like dofetilide, dronedarone, quinidine -cisapride -erythromycin -lomitapide -other medicines that prolong the QT interval (cause an abnormal heart rhythm) -pimozide -terfenadine -thioridazine -tolvaptan -ziprasidone  This medicine may also interact with the following medications: -antiviral medicines for HIV or AIDS -birth control pills -certain antibiotics like  rifabutin, rifampin -certain medicines for blood pressure like amlodipine, isradipine, felodipine, hydrochlorothiazide, losartan, nifedipine -certain medicines for cancer like cyclophosphamide, vinblastine, vincristine -certain medicines for cholesterol like atorvastatin, lovastatin, fluvastatin, simvastatin -certain medicines for depression, anxiety, or psychotic disturbances like amitriptyline, midazolam, nortriptyline, triazolam -certain medicines for diabetes like glipizide, glyburide, tolbutamide -certain medicines for pain like alfentanil, fentanyl, methadone -certain medicines for seizures like carbamazepine, phenytoin -certain medicines that treat or prevent blood clots like warfarin -halofantrine -medicines that lower your chance of fighting infection like cyclosporine, prednisone, tacrolimus -NSAIDS, medicines for pain and inflammation, like celecoxib, diclofenac, flurbiprofen, ibuprofen, meloxicam, naproxen -other medicines for fungal infections -sirolimus -theophylline -tofacitinib This list may not describe all possible interactions. Give your health care provider a list of all the medicines, herbs, non-prescription drugs, or dietary supplements you use. Also tell them if you smoke, drink alcohol, or use illegal drugs. Some items may interact with your medicine. What should I watch for while using this medicine? Visit your doctor or health care professional for regular checkups. If you are taking this medicine for a long time you may need blood work. Tell your doctor if your symptoms do not improve. Some fungal infections need many weeks or months of treatment to cure. Alcohol can increase possible damage to your liver. Avoid alcoholic drinks. If you have a vaginal infection, do not have sex until you have finished your treatment. You can wear a sanitary napkin. Do not use tampons. Wear freshly washed cotton, not synthetic, panties. What side effects may I notice from receiving this  medicine? Side effects that you should report to your doctor or health care professional as soon as possible: -allergic reactions like skin rash or itching, hives,   swelling of the lips, mouth, tongue, or throat -dark urine -feeling dizzy or faint -irregular heartbeat or chest pain -redness, blistering, peeling or loosening of the skin, including inside the mouth -trouble breathing -unusual bruising or bleeding -vomiting -yellowing of the eyes or skin  Side effects that usually do not require medical attention (report to your doctor or health care professional if they continue or are bothersome): -changes in how food tastes -diarrhea -headache -stomach upset or nausea This list may not describe all possible side effects. Call your doctor for medical advice about side effects. You may report side effects to FDA at 1-800-FDA-1088. Where should I keep my medicine? Keep out of the reach of children. Store at room temperature below 30 degrees C (86 degrees F). Throw away any medicine after the expiration date. NOTE: This sheet is a summary. It may not cover all possible information. If you have questions about this medicine, talk to your doctor, pharmacist, or health care provider.  2014, Elsevier/Gold Standard. (2012-10-17 16:13:04) Monilial Vaginitis Vaginitis in a soreness, swelling and redness (inflammation) of the vagina and vulva. Monilial vaginitis is not a sexually transmitted infection. CAUSES  Yeast vaginitis is caused by yeast (candida) that is normally found in your vagina. With a yeast infection, the candida has overgrown in number to a point that upsets the chemical balance. SYMPTOMS   White, thick vaginal discharge.  Swelling, itching, redness and irritation of the vagina and possibly the lips of the vagina (vulva).  Burning or painful urination.  Painful intercourse. DIAGNOSIS  Things that may contribute to monilial vaginitis are:  Postmenopausal and virginal  states.  Pregnancy.  Infections.  Being tired, sick or stressed, especially if you had monilial vaginitis in the past.  Diabetes. Good control will help lower the chance.  Birth control pills.  Tight fitting garments.  Using bubble bath, feminine sprays, douches or deodorant tampons.  Taking certain medications that kill germs (antibiotics).  Sporadic recurrence can occur if you become ill. TREATMENT  Your caregiver will give you medication.  There are several kinds of anti monilial vaginal creams and suppositories specific for monilial vaginitis. For recurrent yeast infections, use a suppository or cream in the vagina 2 times a week, or as directed.  Anti-monilial or steroid cream for the itching or irritation of the vulva may also be used. Get your caregiver's permission.  Painting the vagina with methylene blue solution may help if the monilial cream does not work.  Eating yogurt may help prevent monilial vaginitis. HOME CARE INSTRUCTIONS   Finish all medication as prescribed.  Do not have sex until treatment is completed or after your caregiver tells you it is okay.  Take warm sitz baths.  Do not douche.  Do not use tampons, especially scented ones.  Wear cotton underwear.  Avoid tight pants and panty hose.  Tell your sexual partner that you have a yeast infection. They should go to their caregiver if they have symptoms such as mild rash or itching.  Your sexual partner should be treated as well if your infection is difficult to eliminate.  Practice safer sex. Use condoms.  Some vaginal medications cause latex condoms to fail. Vaginal medications that harm condoms are:  Cleocin cream.  Butoconazole (Femstat).  Terconazole (Terazol) vaginal suppository.  Miconazole (Monistat) (may be purchased over the counter). SEEK MEDICAL CARE IF:   You have a temperature by mouth above 102 F (38.9 C).  The infection is getting worse after 2 days of  treatment.    The infection is not getting better after 3 days of treatment.  You develop blisters in or around your vagina.  You develop vaginal bleeding, and it is not your menstrual period.  You have pain when you urinate.  You develop intestinal problems.  You have pain with sexual intercourse. Document Released: 12/19/2004 Document Revised: 06/03/2011 Document Reviewed: 09/02/2008 ExitCare Patient Information 2014 [employer].  

## 2013-06-15 LAB — HEPATITIS C ANTIBODY: HCV Ab: NEGATIVE

## 2013-06-15 LAB — HIV ANTIBODY (ROUTINE TESTING W REFLEX): HIV: NONREACTIVE

## 2013-06-15 LAB — GC/CHLAMYDIA PROBE AMP
CT Probe RNA: NEGATIVE
GC Probe RNA: NEGATIVE

## 2013-06-15 LAB — RPR

## 2013-06-15 LAB — HEPATITIS B SURFACE ANTIGEN: HEP B S AG: NEGATIVE

## 2013-06-17 ENCOUNTER — Telehealth: Payer: Self-pay | Admitting: *Deleted

## 2013-06-17 MED ORDER — METRONIDAZOLE 500 MG PO TABS: 500.0000 mg | ORAL_TABLET | Freq: Every day | ORAL | Status: DC

## 2013-06-17 NOTE — Telephone Encounter (Signed)
Pt was seen on 06/14/13 for BV infection rx sent for Flagyl 500 mg one by mouth twice a day for 7 days #14. Pt is in school in [place] when returning home she noticed she was short 2 pill from Rx.  [facility] declined to give patient her 2 missing pill which they shorted her. Pt called today want to complete her full 7 day dose asked if 2 pill could be sent to pharmacy [place] for her. I will send the remaining to pills for pt to complete her total dose of #14 pills.

## 2013-11-10 ENCOUNTER — Other Ambulatory Visit: Payer: Self-pay | Admitting: Gynecology

## 2013-12-01 ENCOUNTER — Encounter: Payer: BC Managed Care – PPO | Admitting: Women's Health

## 2013-12-06 ENCOUNTER — Telehealth: Payer: Self-pay | Admitting: *Deleted

## 2013-12-06 MED ORDER — NORGESTREL-ETHINYL ESTRADIOL 0.3-30 MG-MCG PO TABS: 1.0000 | ORAL_TABLET | Freq: Every day | ORAL | Status: DC

## 2013-12-06 NOTE — Telephone Encounter (Signed)
Pt has annual scheduled on 01/04/14 need 1 pack of pill until appointment. Rx sent

## 2014-01-04 ENCOUNTER — Ambulatory Visit (INDEPENDENT_AMBULATORY_CARE_PROVIDER_SITE_OTHER): Payer: BC Managed Care – PPO | Admitting: Women's Health

## 2014-01-04 ENCOUNTER — Encounter: Payer: Self-pay | Admitting: Women's Health

## 2014-01-04 VITALS — BP 120/70 | Ht 69.0 in | Wt 152.0 lb

## 2014-01-04 DIAGNOSIS — Z01419 Encounter for gynecological examination (general) (routine) without abnormal findings: Secondary | ICD-10-CM

## 2014-01-04 DIAGNOSIS — Z3041 Encounter for surveillance of contraceptive pills: Secondary | ICD-10-CM

## 2014-01-04 LAB — CBC WITH DIFFERENTIAL/PLATELET
BASOS ABS: 0 10*3/uL (ref 0.0–0.1)
BASOS PCT: 0 % (ref 0–1)
EOS PCT: 1 % (ref 0–5)
Eosinophils Absolute: 0 10*3/uL (ref 0.0–0.7)
HEMATOCRIT: 33.7 % — AB (ref 36.0–46.0)
HEMOGLOBIN: 11.5 g/dL — AB (ref 12.0–15.0)
Lymphocytes Relative: 27 % (ref 12–46)
Lymphs Abs: 1.3 10*3/uL (ref 0.7–4.0)
MCH: 26.1 pg (ref 26.0–34.0)
MCHC: 34.1 g/dL (ref 30.0–36.0)
MCV: 76.6 fL — AB (ref 78.0–100.0)
MONOS PCT: 8 % (ref 3–12)
Monocytes Absolute: 0.4 10*3/uL (ref 0.1–1.0)
NEUTROS ABS: 3.1 10*3/uL (ref 1.7–7.7)
Neutrophils Relative %: 64 % (ref 43–77)
Platelets: 406 10*3/uL — ABNORMAL HIGH (ref 150–400)
RBC: 4.4 MIL/uL (ref 3.87–5.11)
RDW: 14.3 % (ref 11.5–15.5)
WBC: 4.9 10*3/uL (ref 4.0–10.5)

## 2014-01-04 MED ORDER — NORGESTREL-ETHINYL ESTRADIOL 0.3-30 MG-MCG PO TABS: 1.0000 | ORAL_TABLET | Freq: Every day | ORAL | Status: DC

## 2014-01-04 NOTE — Patient Instructions (Signed)

## 2014-01-04 NOTE — Progress Notes (Signed)
Chelsea LorenzoCharla Fletcher 07/02/1990 [id number]    History:    Presents for annual exam.  Regular monthly cycle on Ortho-Cyclen, not sexually active/neg screen with past partner.  Gardasil series completed. Pap normal 2013.  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 Father hypertension. Mother hypercholesteremia.  ROS:  A  12 point ROS was performed and pertinent positives and negatives are included.  Exam:  Filed Vitals:   01/04/14 1406  BP: 120/70    General appearance:  Normal Thyroid:  Symmetrical, normal in size, without palpable masses or nodularity. Respiratory  Auscultation:  Clear without wheezing or rhonchi Cardiovascular  Auscultation:  Regular rate, without rubs, murmurs or gallops  Edema/varicosities:  Not grossly evident Abdominal  Soft,nontender, without masses, guarding or rebound.  Liver/spleen:  No organomegaly noted  Hernia:  None appreciated  Skin  Inspection:  Grossly normal   Breasts: Examined lying and sitting.     Right: Without masses, retractions, discharge or axillary adenopathy.     Left: Without masses, retractions, discharge or axillary adenopathy. Gentitourinary   Inguinal/mons:  Normal without inguinal adenopathy  External genitalia:  Normal  BUS/Urethra/Skene's glands:  Normal  Vagina:  Normal  Cervix:  Normal  Uterus:  normal in size, shape and contour.  Midline and mobile  Adnexa/parametria:     Rt: Without masses or tenderness.   Lt: Without masses or tenderness.  Anus and perineum: Normal   Assessment/Plan:  23 y.o. SBF G0 for annual exam with no complaints.  Monthly cycle on Ortho-Cyclen  Plan: Ortho-Cyclen prescription, proper use given and reviewed slight risk for blood clots and strokes. Condoms encouraged if sexually active. SBE's, regular exercise, calcium rich diet, MVI daily  encouraged. CBC, UA, Pap normal 2013, new screening guidelines reviewed. Declines need for STD screen.    Harrington ChallengerYOUNG,NANCY J WHNP, 5:25 PM 01/04/2014

## 2014-01-05 LAB — URINALYSIS W MICROSCOPIC + REFLEX CULTURE
Bacteria, UA: NONE SEEN
Bilirubin Urine: NEGATIVE
CASTS: NONE SEEN
CRYSTALS: NONE SEEN
Glucose, UA: NEGATIVE mg/dL
Hgb urine dipstick: NEGATIVE
Ketones, ur: NEGATIVE mg/dL
LEUKOCYTES UA: NEGATIVE
NITRITE: NEGATIVE
PH: 7.5 (ref 5.0–8.0)
Protein, ur: NEGATIVE mg/dL
SPECIFIC GRAVITY, URINE: 1.009 (ref 1.005–1.030)
UROBILINOGEN UA: 0.2 mg/dL (ref 0.0–1.0)

## 2015-01-17 ENCOUNTER — Ambulatory Visit (INDEPENDENT_AMBULATORY_CARE_PROVIDER_SITE_OTHER): Payer: BC Managed Care – PPO | Admitting: Women's Health

## 2015-01-17 ENCOUNTER — Other Ambulatory Visit (HOSPITAL_COMMUNITY)
Admission: RE | Admit: 2015-01-17 | Discharge: 2015-01-17 | Disposition: A | Discharge status: Home / Self Care | Payer: BLUE CROSS/BLUE SHIELD | Source: Ambulatory Visit | Attending: Gynecology | Admitting: Gynecology

## 2015-01-17 ENCOUNTER — Encounter: Payer: Self-pay | Admitting: Women's Health

## 2015-01-17 VITALS — Ht 69.0 in | Wt 176.0 lb

## 2015-01-17 DIAGNOSIS — Z01419 Encounter for gynecological examination (general) (routine) without abnormal findings: Secondary | ICD-10-CM

## 2015-01-17 DIAGNOSIS — Z3041 Encounter for surveillance of contraceptive pills: Secondary | ICD-10-CM

## 2015-01-17 DIAGNOSIS — Z113 Encounter for screening for infections with a predominantly sexual mode of transmission: Secondary | ICD-10-CM | POA: Diagnosis not present

## 2015-01-17 LAB — CBC WITH DIFFERENTIAL/PLATELET
BASOS PCT: 0 % (ref 0–1)
Basophils Absolute: 0 10*3/uL (ref 0.0–0.1)
EOS ABS: 0 10*3/uL (ref 0.0–0.7)
EOS PCT: 1 % (ref 0–5)
HCT: 34.2 % — ABNORMAL LOW (ref 36.0–46.0)
Hemoglobin: 10.8 g/dL — ABNORMAL LOW (ref 12.0–15.0)
Lymphocytes Relative: 39 % (ref 12–46)
Lymphs Abs: 1.8 10*3/uL (ref 0.7–4.0)
MCH: 23.3 pg — AB (ref 26.0–34.0)
MCHC: 31.6 g/dL (ref 30.0–36.0)
MCV: 73.9 fL — ABNORMAL LOW (ref 78.0–100.0)
MONO ABS: 0.5 10*3/uL (ref 0.1–1.0)
MONOS PCT: 10 % (ref 3–12)
MPV: 10 fL (ref 8.6–12.4)
Neutro Abs: 2.3 10*3/uL (ref 1.7–7.7)
Neutrophils Relative %: 50 % (ref 43–77)
PLATELETS: 458 10*3/uL — AB (ref 150–400)
RBC: 4.63 MIL/uL (ref 3.87–5.11)
RDW: 14.3 % (ref 11.5–15.5)
WBC: 4.5 10*3/uL (ref 4.0–10.5)

## 2015-01-17 LAB — HEPATITIS C ANTIBODY: HCV Ab: NEGATIVE

## 2015-01-17 LAB — HEPATITIS B SURFACE ANTIGEN: HEP B S AG: NEGATIVE

## 2015-01-17 LAB — HIV ANTIBODY (ROUTINE TESTING W REFLEX): HIV 1&2 Ab, 4th Generation: NONREACTIVE

## 2015-01-17 MED ORDER — NORGESTIMATE-ETH ESTRADIOL 0.25-35 MG-MCG PO TABS
1.0000 | ORAL_TABLET | Freq: Every day | ORAL | Status: DC
Start: 2015-01-17 — End: 2015-01-24

## 2015-01-17 MED ORDER — NORGESTREL-ETHINYL ESTRADIOL 0.3-30 MG-MCG PO TABS: 1.0000 | ORAL_TABLET | Freq: Every day | ORAL | Status: DC

## 2015-01-17 NOTE — Addendum Note (Signed)
Addended by: Kem ParkinsonBARNES, Kooper Chriswell on: 01/17/2015 02:17 PM   Modules accepted: Orders

## 2015-01-17 NOTE — Progress Notes (Signed)
Chelsea LorenzoCharla Tutor 28-Nov-1990 [id number]    History:    Presents for annual exam.  Regular monthly cycle/Cryselle. Negative STD screen March 2015. New partner past 2 months/intermittent condom use. Gardiasil completed in 2008. 2013 Pap normal. Gained 25 lbs this past year, mostly diet related.   Past medical history, past surgical history, family history and social history were all reviewed and documented in the EPIC chart. [place] in [employer] [place], hours are 12:30-9:30pm.  ROS:  A ROS was performed and pertinent positives and negatives are included.  Exam:  There were no vitals filed for this visit.  General appearance:  Normal Thyroid:  Symmetrical, normal in size, without palpable masses or nodularity. Respiratory  Auscultation:  Clear without wheezing or rhonchi Cardiovascular  Auscultation:  Regular rate, without rubs, murmurs or gallops  Edema/varicosities:  Not grossly evident Abdominal  Soft,nontender, without masses, guarding or rebound.  Liver/spleen:  No organomegaly noted  Hernia:  None appreciated  Skin  Inspection:  Grossly normal   Breasts: Examined lying and sitting.     Right: Without masses, retractions, discharge or axillary adenopathy.     Left: Without masses, retractions, discharge or axillary adenopathy. Gentitourinary   Inguinal/mons:  Normal without inguinal adenopathy  External genitalia:  Normal  BUS/Urethra/Skene's glands:  Normal  Vagina:  Normal  Cervix:  Normal  Uterus:  normal in size, shape and contour.  Midline and mobile  Adnexa/parametria:     Rt: Without masses or tenderness.   Lt: Without masses or tenderness.  Anus and perineum: Normal   Assessment/Plan:  24 y.o.  G0 for annual exam with no complaints.  Monthly cycles/CRYSELLE STD screen Weight gain-25 lbs over past year/diet related  Plan: CRYSELLE discontinued due to monthly cost. Ortho-cyclen 0.23-35 mg-mcg 1 tablet by mouth daily. Risks of blood clots, strokes, and breast  cancer reviewed. CBC, UA, Pap, GC/Chlamydia, Hep C, Hep B, HIV, and RPR. Condoms every time until permanent partner. Encouraged to limit calories, eat more frequent small portions throughout the day, begin exercising prior to work daily. Calcium rich diet, MVI encouraged. Educated on SBE's.     Harrington ChallengerYOUNG,Chelsea Fletcher WHNP, 1:04 PM 01/17/2015

## 2015-01-18 LAB — URINALYSIS W MICROSCOPIC + REFLEX CULTURE
Bacteria, UA: NONE SEEN [HPF]
Bilirubin Urine: NEGATIVE
CASTS: NONE SEEN [LPF]
CRYSTALS: NONE SEEN [HPF]
Glucose, UA: NEGATIVE
KETONES UR: NEGATIVE
Nitrite: NEGATIVE
Protein, ur: NEGATIVE
SPECIFIC GRAVITY, URINE: 1.005 (ref 1.001–1.035)
Yeast: NONE SEEN [HPF]
pH: 6.5 (ref 5.0–8.0)

## 2015-01-18 LAB — RPR

## 2015-01-18 LAB — GC/CHLAMYDIA PROBE AMP
CT PROBE, AMP APTIMA: POSITIVE — AB
GC PROBE AMP APTIMA: NEGATIVE

## 2015-01-19 LAB — URINE CULTURE
Colony Count: NO GROWTH
ORGANISM ID, BACTERIA: NO GROWTH

## 2015-01-20 ENCOUNTER — Other Ambulatory Visit: Payer: Self-pay | Admitting: *Deleted

## 2015-01-20 LAB — CYTOLOGY - PAP

## 2015-01-20 MED ORDER — AZITHROMYCIN 500 MG PO TABS: 500.0000 mg | ORAL_TABLET | Freq: Once | ORAL | Status: AC

## 2015-01-23 ENCOUNTER — Telehealth: Payer: Self-pay | Admitting: *Deleted

## 2015-01-23 NOTE — Telephone Encounter (Signed)
(  pt aware you are out of the office) Pt was prescribed ortho-cyclen on OV 01/17/15 pt said pills are not free, would like to have old Rx for Crysella 28 tablets. Please advise

## 2015-01-23 NOTE — Telephone Encounter (Signed)
Ok for refill for 1 year.

## 2015-01-24 MED ORDER — NORGESTREL-ETHINYL ESTRADIOL 0.3-30 MG-MCG PO TABS: 1.0000 | ORAL_TABLET | Freq: Every day | ORAL | Status: AC

## 2015-01-24 NOTE — Telephone Encounter (Signed)
Left on voicemail Rx sent 

## 2015-02-08 ENCOUNTER — Telehealth: Payer: Self-pay | Admitting: *Deleted

## 2015-02-08 NOTE — Telephone Encounter (Signed)
Pt said [facility] is [facility] [place] and have them fax results to our office.

## 2015-02-14 ENCOUNTER — Ambulatory Visit: Payer: BC Managed Care – PPO | Admitting: Women's Health

## 2016-08-07 ENCOUNTER — Encounter: Payer: Self-pay | Admitting: Gynecology
